# Patient Record
Sex: Male | Born: 1940 | Race: White | Hispanic: No | Marital: Married | Smoking: Former smoker
Health system: Southern US, Community
[De-identification: ages and names within clinical notes are randomized; demographics above are authoritative.]

## PROBLEM LIST (undated history)

## (undated) DIAGNOSIS — N400 Enlarged prostate without lower urinary tract symptoms: Secondary | ICD-10-CM

## (undated) DIAGNOSIS — J45909 Unspecified asthma, uncomplicated: Secondary | ICD-10-CM

## (undated) DIAGNOSIS — J302 Other seasonal allergic rhinitis: Secondary | ICD-10-CM

## (undated) DIAGNOSIS — G4700 Insomnia, unspecified: Secondary | ICD-10-CM

## (undated) DIAGNOSIS — I10 Essential (primary) hypertension: Secondary | ICD-10-CM

## (undated) DIAGNOSIS — E7800 Pure hypercholesterolemia, unspecified: Secondary | ICD-10-CM

## (undated) DIAGNOSIS — K219 Gastro-esophageal reflux disease without esophagitis: Secondary | ICD-10-CM

## (undated) DIAGNOSIS — R001 Bradycardia, unspecified: Secondary | ICD-10-CM

## (undated) DIAGNOSIS — R252 Cramp and spasm: Secondary | ICD-10-CM

## (undated) DIAGNOSIS — I00 Rheumatic fever without heart involvement: Secondary | ICD-10-CM

## (undated) DIAGNOSIS — A809 Acute poliomyelitis, unspecified: Secondary | ICD-10-CM

## (undated) DIAGNOSIS — R55 Syncope and collapse: Secondary | ICD-10-CM

## (undated) DIAGNOSIS — L409 Psoriasis, unspecified: Secondary | ICD-10-CM

## (undated) HISTORY — DX: Acute poliomyelitis, unspecified: A80.9

## (undated) HISTORY — DX: Cramp and spasm: R25.2

## (undated) HISTORY — DX: Essential (primary) hypertension: I10

## (undated) HISTORY — DX: Rheumatic fever without heart involvement: I00

## (undated) HISTORY — DX: Syncope and collapse: R55

## (undated) HISTORY — DX: Other seasonal allergic rhinitis: J30.2

## (undated) HISTORY — DX: Unspecified asthma, uncomplicated: J45.909

## (undated) HISTORY — DX: Benign prostatic hyperplasia without lower urinary tract symptoms: N40.0

## (undated) HISTORY — DX: Psoriasis, unspecified: L40.9

## (undated) HISTORY — DX: Insomnia, unspecified: G47.00

## (undated) HISTORY — DX: Gastro-esophageal reflux disease without esophagitis: K21.9

## (undated) HISTORY — DX: Pure hypercholesterolemia, unspecified: E78.00

## (undated) HISTORY — DX: Bradycardia, unspecified: R00.1

---

## 2009-10-11 ENCOUNTER — Ambulatory Visit (HOSPITAL_COMMUNITY): Admission: RE | Admit: 2009-10-11 | Discharge: 2009-10-11 | Payer: Self-pay | Admitting: Cardiology

## 2009-10-21 DIAGNOSIS — R55 Syncope and collapse: Secondary | ICD-10-CM

## 2009-10-21 HISTORY — DX: Syncope and collapse: R55

## 2011-08-28 ENCOUNTER — Emergency Department (HOSPITAL_COMMUNITY)
Admission: EM | Admit: 2011-08-28 | Discharge: 2011-08-28 | Disposition: A | Discharge status: Home / Self Care | Payer: BC Managed Care – PPO | Attending: Emergency Medicine | Admitting: Emergency Medicine

## 2011-08-28 DIAGNOSIS — R55 Syncope and collapse: Secondary | ICD-10-CM | POA: Insufficient documentation

## 2011-08-28 DIAGNOSIS — I10 Essential (primary) hypertension: Secondary | ICD-10-CM | POA: Insufficient documentation

## 2011-08-28 DIAGNOSIS — R110 Nausea: Secondary | ICD-10-CM | POA: Insufficient documentation

## 2011-08-28 DIAGNOSIS — E7800 Pure hypercholesterolemia, unspecified: Secondary | ICD-10-CM | POA: Insufficient documentation

## 2011-08-28 DIAGNOSIS — R42 Dizziness and giddiness: Secondary | ICD-10-CM | POA: Insufficient documentation

## 2011-08-28 DIAGNOSIS — Z8612 Personal history of poliomyelitis: Secondary | ICD-10-CM | POA: Insufficient documentation

## 2011-08-28 LAB — CBC
MCHC: 35.6 g/dL (ref 30.0–36.0)
Platelets: 153 10*3/uL (ref 150–400)
RDW: 12.6 % (ref 11.5–15.5)

## 2011-08-28 LAB — URINALYSIS, ROUTINE W REFLEX MICROSCOPIC
Hgb urine dipstick: NEGATIVE
Nitrite: NEGATIVE
Protein, ur: NEGATIVE mg/dL
Urobilinogen, UA: 1 mg/dL (ref 0.0–1.0)

## 2011-08-28 LAB — BASIC METABOLIC PANEL
Calcium: 8 mg/dL — ABNORMAL LOW (ref 8.4–10.5)
GFR calc Af Amer: >60 mL/min (ref >60)
GFR calc non Af Amer: >60 mL/min (ref >60)
Glucose, Bld: 108 mg/dL — ABNORMAL HIGH (ref 70–99)
Sodium: 141 mEq/L (ref 135–145)

## 2011-08-28 LAB — DIFFERENTIAL
Basophils Absolute: 0 10*3/uL (ref 0.0–0.1)
Basophils Relative: 0 % (ref 0–1)
Eosinophils Absolute: 0.1 10*3/uL (ref 0.0–0.7)
Eosinophils Relative: 2 % (ref 0–5)
Monocytes Absolute: 0.5 10*3/uL (ref 0.1–1.0)

## 2012-07-06 ENCOUNTER — Other Ambulatory Visit: Payer: Self-pay | Admitting: Family Medicine

## 2012-07-06 DIAGNOSIS — N5089 Other specified disorders of the male genital organs: Secondary | ICD-10-CM

## 2012-07-14 ENCOUNTER — Ambulatory Visit
Admission: RE | Admit: 2012-07-14 | Discharge: 2012-07-14 | Disposition: A | Discharge status: Home / Self Care | Payer: BC Managed Care – PPO | Source: Ambulatory Visit | Attending: Family Medicine | Admitting: Family Medicine

## 2012-07-14 DIAGNOSIS — N5089 Other specified disorders of the male genital organs: Secondary | ICD-10-CM

## 2012-12-20 IMAGING — US US SCROTUM
1 series · 14 of 25 positions shown · non-contrast
Comparison: None

CLINICAL DATA: 70-year-old male with left testicular mass
identified on clinical examination.

ULTRASOUND OF SCROTUM
TECHNIQUE: Complete ultrasound examination of the testicles,
epididymis, and other scrotal structures was performed.

[Series 1: us scrotum · 0.07mm/px · 14 of 61 slices shown]
[im 1/61]
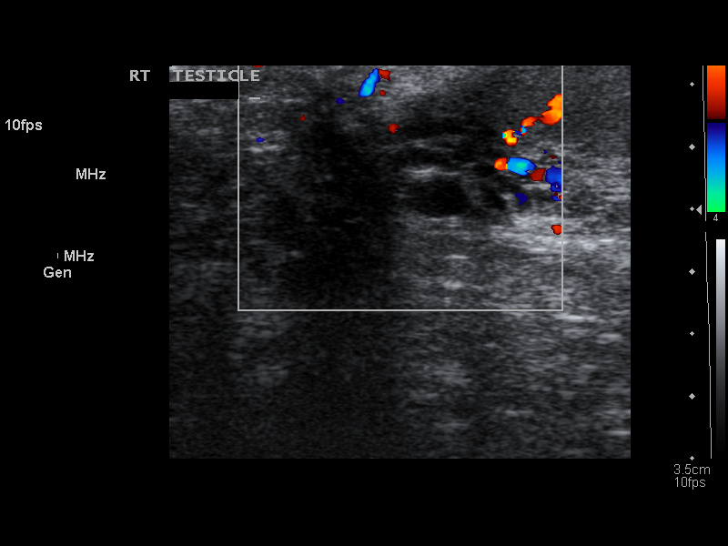
[im 6/61]
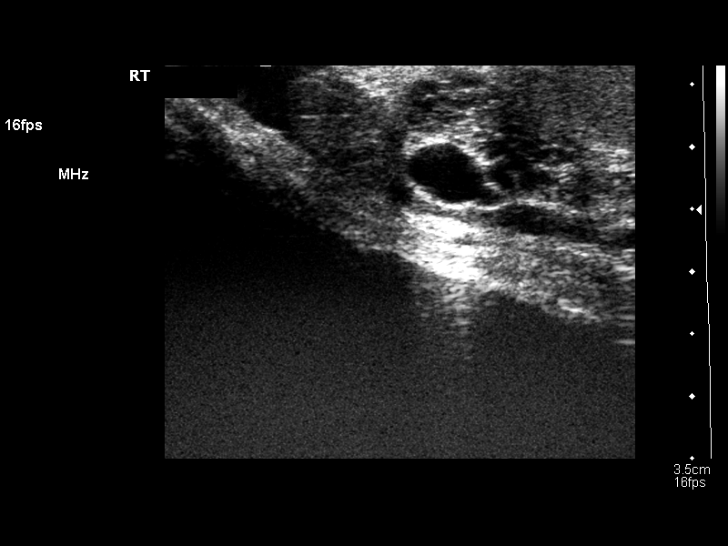
[im 11/61]
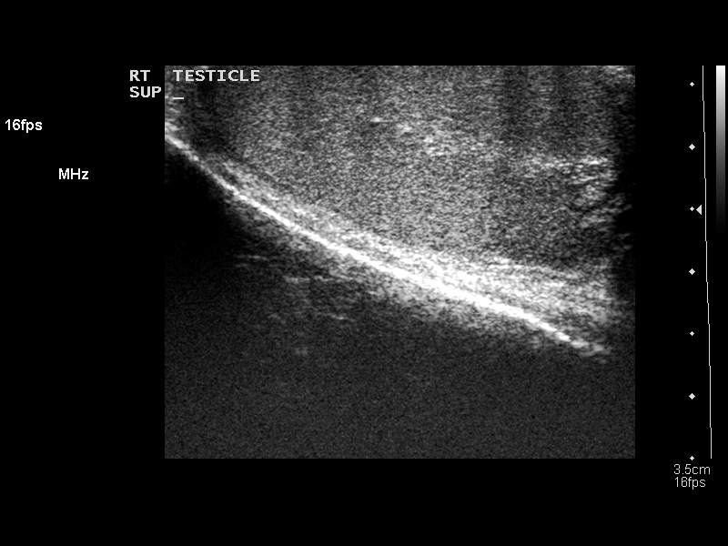
[im 16/61]
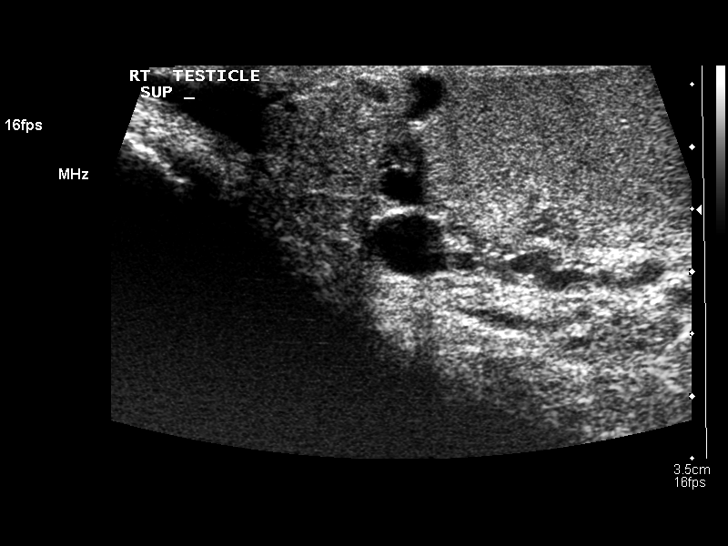
[im 21/61]
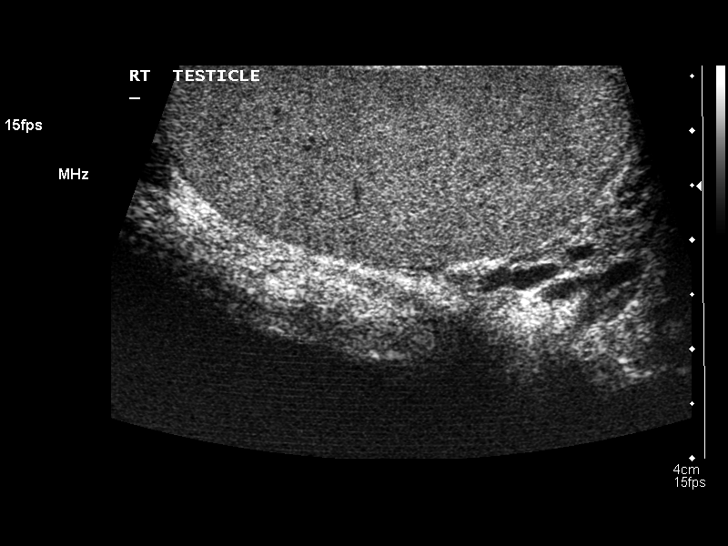
[im 23/61]
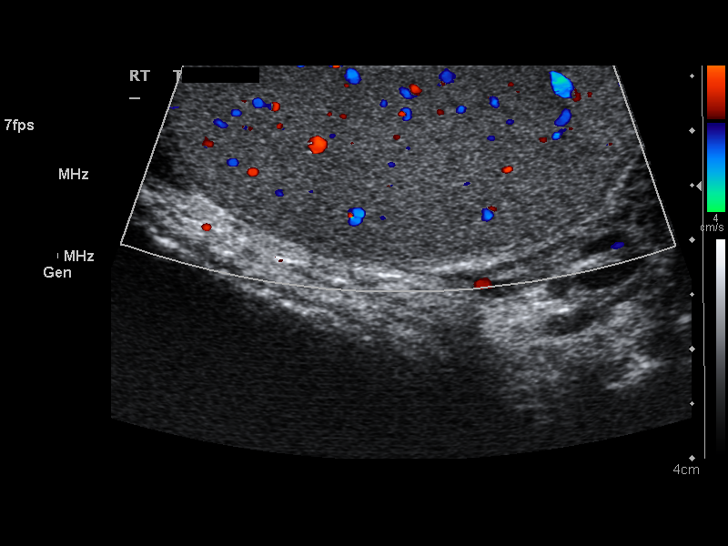
[im 28/61]
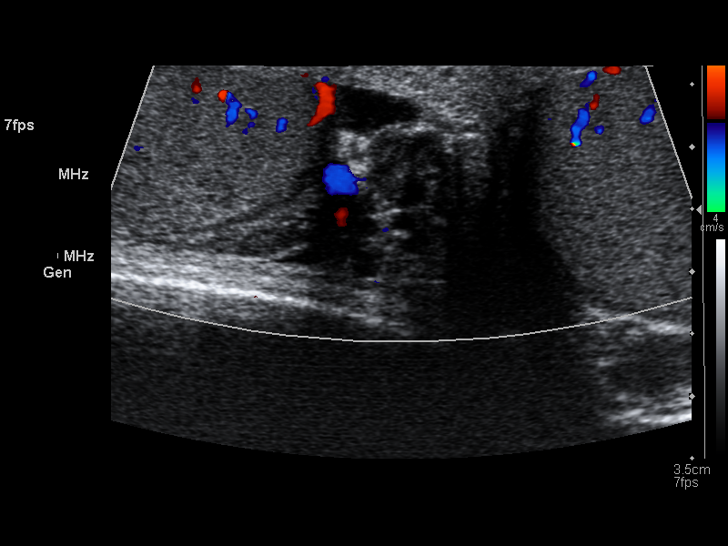
[im 33/61]
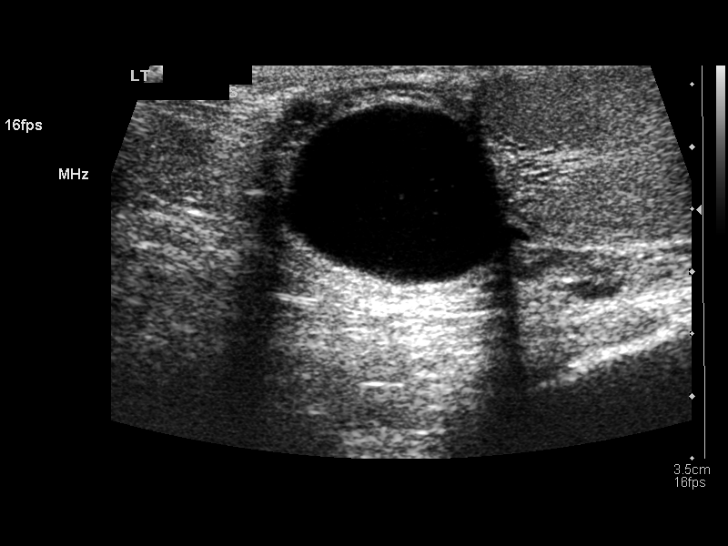
[im 38/61]
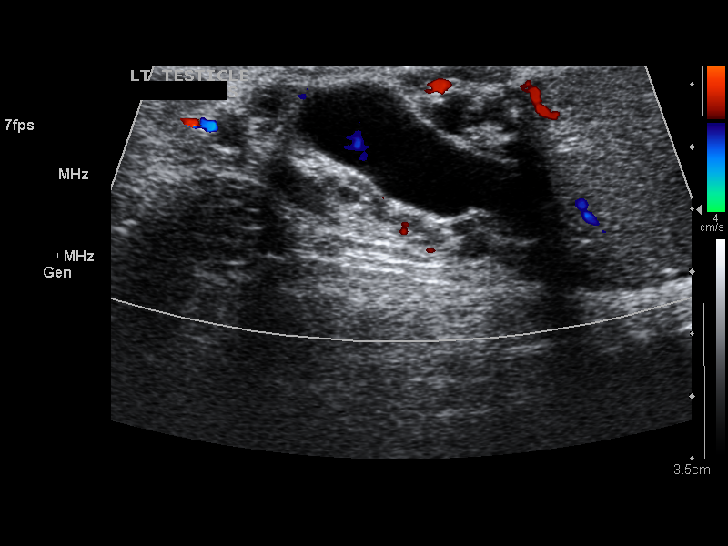
[im 41/61]
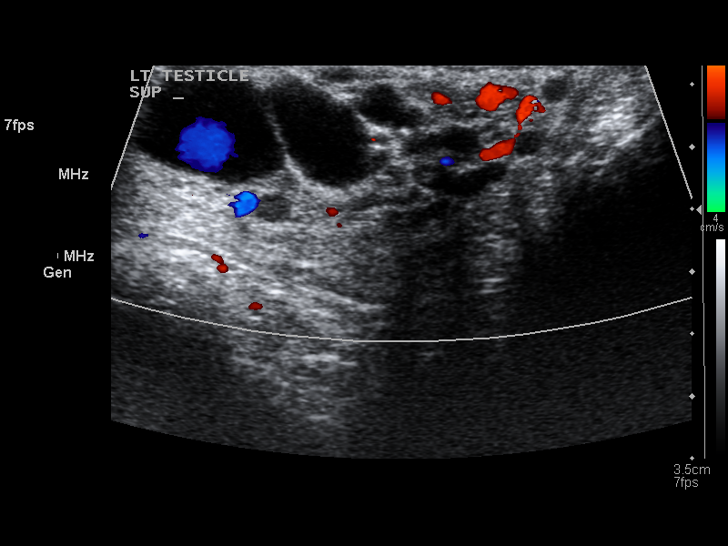
[im 46/61]
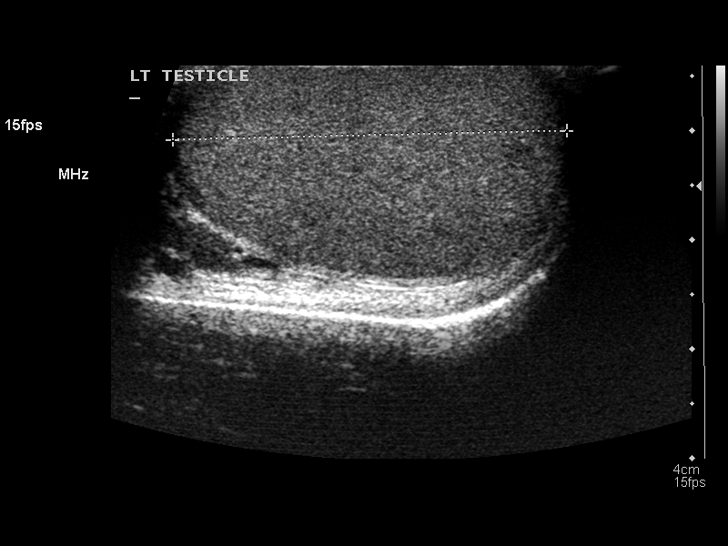
[im 51/61]
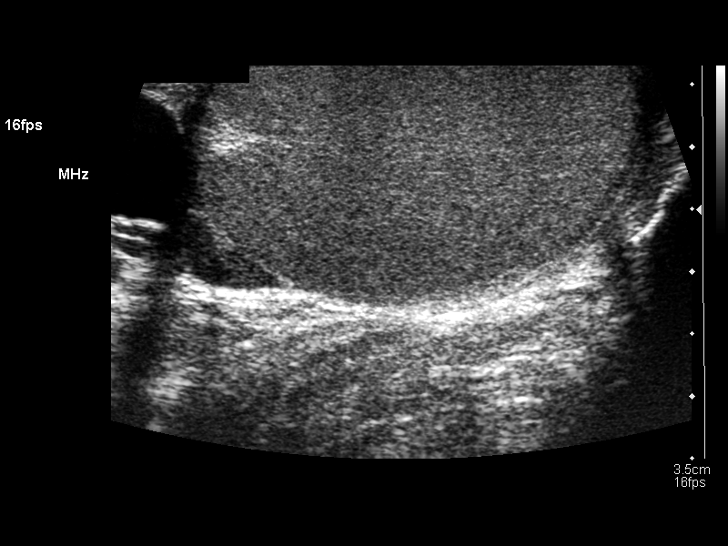
[im 56/61]
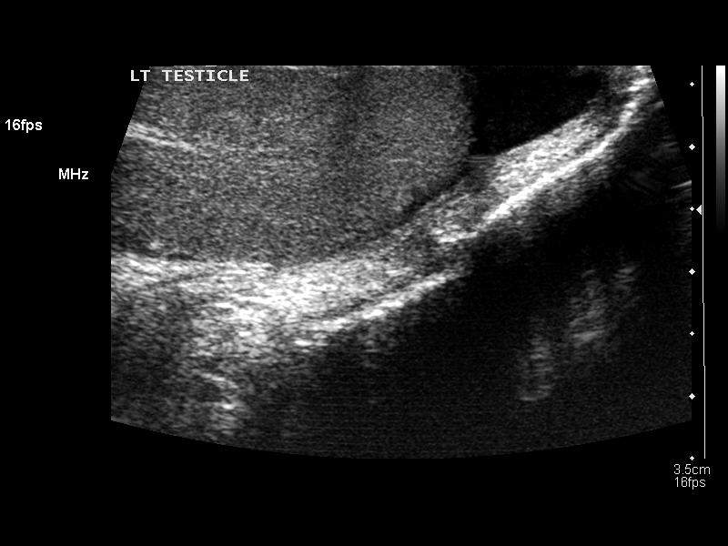
[im 61/61]
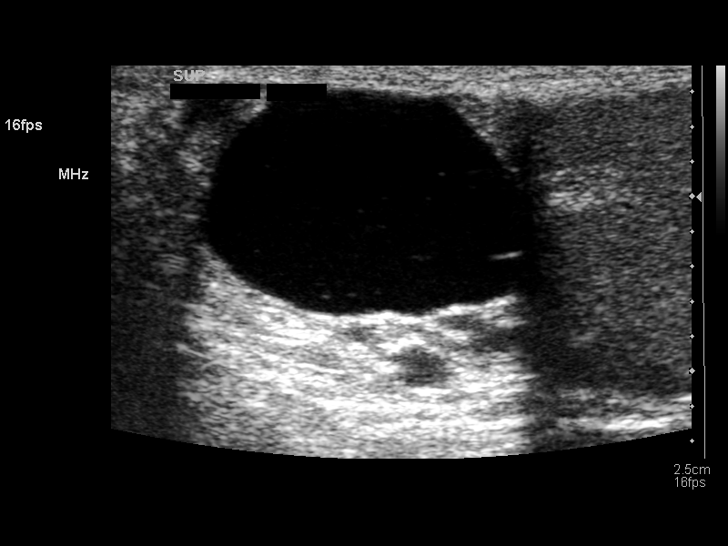

[14 of 25 positions shown; findings below may reference images not displayed]

FINDINGS: Right testis:  The right testicle is normal in echogenicity and
size.  No focal testicular lesions are identified.  Normal color
Doppler flow is present.

Left testis:  The left testicle is normal in echogenicity and size.
No focal testicular lesions are identified.  Normal color Doppler
flow is present.

Right epididymis:  Small epididymal cysts are present.

Left epididymis:  Two separate 1.8 cm epididymal
cysts/spermatoceles are identified, corresponding to the area of
palpable concern.

Hydrocele:  Very small bilateral hydroceles are present.

Varicocele:  Absent
IMPRESSION: Two separate 1.8 cm left epididymal cysts/spermatoceles correspond
to the area of palpable concern.

Normal testicles bilaterally.

## 2014-01-25 ENCOUNTER — Other Ambulatory Visit: Payer: Self-pay | Admitting: *Deleted

## 2014-01-25 MED ORDER — LOSARTAN POTASSIUM 50 MG PO TABS: 50.0000 mg | ORAL_TABLET | Freq: Every day | ORAL | Status: DC

## 2014-02-16 ENCOUNTER — Encounter: Payer: Self-pay | Admitting: General Surgery

## 2014-02-16 DIAGNOSIS — R55 Syncope and collapse: Secondary | ICD-10-CM

## 2014-02-16 DIAGNOSIS — I10 Essential (primary) hypertension: Secondary | ICD-10-CM

## 2014-02-19 ENCOUNTER — Ambulatory Visit (INDEPENDENT_AMBULATORY_CARE_PROVIDER_SITE_OTHER): Payer: Medicare Other | Admitting: Cardiology

## 2014-02-19 ENCOUNTER — Encounter: Payer: Self-pay | Admitting: Cardiology

## 2014-02-19 VITALS — BP 144/76 | HR 66 | Ht 68.0 in | Wt 189.0 lb

## 2014-02-19 DIAGNOSIS — R55 Syncope and collapse: Secondary | ICD-10-CM

## 2014-02-19 DIAGNOSIS — I10 Essential (primary) hypertension: Secondary | ICD-10-CM

## 2014-02-19 MED ORDER — VALSARTAN 80 MG PO TABS: 80.0000 mg | ORAL_TABLET | Freq: Every day | ORAL | Status: DC

## 2014-02-19 NOTE — Patient Instructions (Signed)
Your physician recommends that you continue on your current medications as directed. Please refer to the Current Medication list given to you today.  Your physician wants you to follow-up in: 1 year with Dr. Turner. You will receive a reminder letter in the mail two months in advance. If you don't receive a letter, please call our office to schedule the follow-up appointment.  

## 2014-02-19 NOTE — Progress Notes (Signed)
[address] [place], [place]  [address] Phone: [phone]) [phone] Fax:  [phone]  Date:  02/19/2014   ID:  Louis CalkinsJames Spaugh, DOB Nov 29, 1941, MRN [id number]  PCP:  Lolita PatellaEADE,ROBERT ALEXANDER, MD  Cardiologist:  Armanda Magicraci Juanluis Guastella, MD     History of Present Illness: Louis CalkinsJames Saunders is a 73 y.o. male with a history of vasovagal syncope, HTN, rheumatic fever as a child and bradycardia who presents today for followup.  He is doing well.  He denies any dizzy spells or syncope.  He denies any chest pain, SOB, DOE, LE edema or palpitations.   Wt Readings from Last 3 Encounters:  02/19/14 189 lb (85.73 kg)     Past Medical History  Diagnosis Date  . Hypertension   . Hypercholesteremia   . Seasonal allergies   . Asthma     mild exercise induced  . Insomnia     periodic  . Leg cramps   . Psoriasis   . BPH (benign prostatic hypertrophy)   . GERD (gastroesophageal reflux disease)   . Polio     age 72 months. Subsequently needed ortho sx  . Rheumatic fever     X2 as a child with out sequela  . Vasovagal syncope 10/21/09    dx by tilt table test   . Bradycardia     Current Outpatient Prescriptions  Medication Sig Dispense Refill  . albuterol (PROVENTIL HFA;VENTOLIN HFA) 108 (90 BASE) MCG/ACT inhaler Inhale 1-2 puffs into the lungs every 6 (six) hours as needed for wheezing or shortness of breath.      Marland Kitchen. aspirin 81 MG tablet Take 81 mg by mouth daily.      . calcipotriene (DOVONOX) 0.005 % cream Apply 1 application topically as needed.      . finasteride (PROSCAR) 5 MG tablet Take 5 mg by mouth daily.      Marland Kitchen. levocetirizine (XYZAL) 5 MG tablet Take 5 mg by mouth every evening.      . Magnesium 400 MG CAPS Take 1 capsule by mouth daily.      . montelukast (SINGULAIR) 10 MG tablet Take 10 mg by mouth at bedtime.      . Omega 3 1000 MG CAPS Take 2 capsules by mouth daily.      Marland Kitchen. omeprazole (PRILOSEC) 20 MG capsule Take 20 mg by mouth daily.      . Probiotic Product (PROBIOTIC DAILY PO) Take  by mouth.      . rosuvastatin (CRESTOR) 5 MG tablet Take 2.5 mg by mouth daily.      . sertraline (ZOLOFT) 50 MG tablet Take 50 mg by mouth daily.      . valsartan (DIOVAN) 80 MG tablet Take 80 mg by mouth daily.      Marland Kitchen. zolpidem (AMBIEN) 10 MG tablet Take 10 mg by mouth at bedtime as needed for sleep.       No current facility-administered medications for this visit.    Allergies:    Allergies  Allergen Reactions  . Beta Adrenergic Blockers     Associated with Vaso-Vagal episodes  . Biaxin [Clarithromycin] Hives and Swelling  . Codeine Nausea Only    COLD SWEATS  . Doxycycline     Upset Stomach  . Lipitor [Atorvastatin]     Muscle aches    Social History:  The patient  reports that he quit smoking about 47 years ago. He does not have any smokeless tobacco history on file. He reports that he drinks alcohol. He reports  that he does not use illicit drugs.   Family History:  The patient's family history is not on file.   ROS:  Please see the history of present illness.      All other systems reviewed and negative.   PHYSICAL EXAM: VS:  BP 144/76  Pulse 66  Ht 5\' 8"  (1.727 m)  Wt 189 lb (85.73 kg)  BMI 28.74 kg/m2 Well nourished, well developed, in no acute distress HEENT: normal Neck: no JVD Cardiac:  normal S1, S2; RRR; no murmur Lungs:  clear to auscultation bilaterally, no wheezing, rhonchi or rales Abd: soft, nontender, no hepatomegaly Ext: no edema Skin: warm and dry Neuro:  CNs 2-12 intact, no focal abnormalities noted   EKG:  NSR with no ST changes   ASSESSMENT AND PLAN:  1. Vasovagal syncope with no reoccurence 2. HTN - well controlled  - continue Diovan  Followup with me in 1 year  Signed, Armanda Magic, MD 02/19/2014 4:24 PM

## 2014-05-28 ENCOUNTER — Telehealth: Payer: Self-pay | Admitting: *Deleted

## 2014-05-28 MED ORDER — SERTRALINE HCL 50 MG PO TABS: 50.0000 mg | ORAL_TABLET | Freq: Every day | ORAL | Status: DC

## 2014-05-28 NOTE — Telephone Encounter (Signed)
Message copied by Carmela Hurt on Mon May 28, 2014 10:39 AM ------      Message from: Armanda Magic R      Created: Thu May 24, 2014  5:53 PM      Regarding: RE: refill on sertraline       yes      ----- Message -----         From: Carmela Hurt, RN         Sent: 05/24/2014   4:42 PM           To: Quintella Reichert, MD, Nita Sells, CMA      Subject: refill on sertraline                                     Pharmacy requesting refill on sertraline 50 mg, do you want to refill?      Thanks, Selena Batten       ------

## 2015-01-01 ENCOUNTER — Ambulatory Visit: Payer: Self-pay | Admitting: Podiatry

## 2015-01-10 ENCOUNTER — Ambulatory Visit (INDEPENDENT_AMBULATORY_CARE_PROVIDER_SITE_OTHER): Payer: Medicare Other | Admitting: Podiatry

## 2015-01-10 ENCOUNTER — Ambulatory Visit (INDEPENDENT_AMBULATORY_CARE_PROVIDER_SITE_OTHER): Payer: Medicare Other

## 2015-01-10 VITALS — BP 112/69 | HR 71 | Resp 16 | Ht 70.0 in | Wt 173.0 lb

## 2015-01-10 DIAGNOSIS — M205X1 Other deformities of toe(s) (acquired), right foot: Secondary | ICD-10-CM | POA: Diagnosis not present

## 2015-01-10 DIAGNOSIS — B91 Sequelae of poliomyelitis: Secondary | ICD-10-CM

## 2015-01-10 DIAGNOSIS — M2042 Other hammer toe(s) (acquired), left foot: Secondary | ICD-10-CM | POA: Diagnosis not present

## 2015-01-10 DIAGNOSIS — M6281 Muscle weakness (generalized): Secondary | ICD-10-CM

## 2015-01-10 NOTE — Progress Notes (Signed)
   Subjective:    Patient ID: Louis Saunders, male    DOB: Dec 27, 1941, 74 y.o.   MRN: [id number]  HPI Comments: Been wearing orthotics for 15 years  I have rough spots on the bottom of my left foot, callused lesions and also have a sore spot on the right foot causing pain to shoot up the right leg      Review of Systems  All other systems reviewed and are negative.      Objective:   Physical Exam: I have reviewed his past mental history medications allergies surgery social history and previous chart with the [facility]. Pulses are strongly palpable bilateral. Neurologic sensorium is intact per Semmes-Weinstein monofilament deep tendon reflexes are intact bilateral muscle strength is slightly weak into the right side which is the post polio side. I see no length discrepancy. Orthopedic evaluation demonstrates all joints distal to the ankle for range of motion without crepitus mild tailor's bunion deformities are noted bilateral hammertoe deformities are noted bilateral.        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  Plan: At this point I encouraged a new set of orthotics fabricated much like facet he currently. I expressed to him that we were unable to perform this task but we will send him to [facility] for this fabrication. I debrided all reactive hyperkeratosis for him today about complications. We will follow up with him as needed.

## 2015-01-14 ENCOUNTER — Telehealth: Payer: Self-pay | Admitting: *Deleted

## 2015-01-14 NOTE — Telephone Encounter (Signed)
Pt called from [facility] states he left the prescription for the orthotic at home and would like us to fax a copy.  I told pt he was given the original prescription, pt thanked me for looking and he will go home to pick up.

## 2015-02-19 ENCOUNTER — Encounter: Payer: Self-pay | Admitting: Cardiology

## 2015-02-19 ENCOUNTER — Ambulatory Visit (INDEPENDENT_AMBULATORY_CARE_PROVIDER_SITE_OTHER): Payer: Medicare Other | Admitting: Cardiology

## 2015-02-19 VITALS — BP 130/68 | HR 63 | Ht 69.5 in | Wt 178.0 lb

## 2015-02-19 DIAGNOSIS — I10 Essential (primary) hypertension: Secondary | ICD-10-CM

## 2015-02-19 DIAGNOSIS — R55 Syncope and collapse: Secondary | ICD-10-CM

## 2015-02-19 NOTE — Progress Notes (Addendum)
Cardiology Office Note   Date:  02/19/2015   ID:  Louis Saunders, DOB 1941/07/09, MRN [id number]  PCP:  Lolita Patella, MD  Cardiologist:   Quintella Reichert, MD   Chief Complaint  Patient presents with  . vasovagal syncope  . Hypertension      History of Present Illness: Louis Saunders is a 74 y.o. male with a history of vasovagal syncope, HTN, rheumatic fever as a child and bradycardia who presents today for followup. He is doing well. He denies any dizzy spells or syncope. He denies any chest pain, SOB, DOE, LE edema or palpitations.    Past Medical History  Diagnosis Date  . Hypertension   . Hypercholesteremia   . Seasonal allergies   . Asthma     mild exercise induced  . Insomnia     periodic  . Leg cramps   . Psoriasis   . BPH (benign prostatic hypertrophy)   . GERD (gastroesophageal reflux disease)   . Polio     age 16 months. Subsequently needed ortho sx  . Rheumatic fever     X2 as a child with out sequela  . Vasovagal syncope 10/21/09    dx by tilt table test   . Bradycardia     History reviewed. No pertinent past surgical history.   Current Outpatient Prescriptions  Medication Sig Dispense Refill  . albuterol (PROVENTIL HFA;VENTOLIN HFA) 108 (90 BASE) MCG/ACT inhaler Inhale 1-2 puffs into the lungs every 6 (six) hours as needed for wheezing or shortness of breath.    Marland Kitchen aspirin 81 MG tablet Take 81 mg by mouth daily.    . calcipotriene (DOVONOX) 0.005 % cream Apply 1 application topically as needed.    . finasteride (PROSCAR) 5 MG tablet Take 5 mg by mouth daily.    Marland Kitchen levocetirizine (XYZAL) 5 MG tablet Take 5 mg by mouth every evening.    . Magnesium 400 MG CAPS Take 1 capsule by mouth daily.    . montelukast (SINGULAIR) 10 MG tablet Take 10 mg by mouth at bedtime.    . Omega 3 1000 MG CAPS Take 2 capsules by mouth daily.    Marland Kitchen omeprazole (PRILOSEC) 20 MG capsule Take 20 mg by mouth daily.    . Probiotic Product (PROBIOTIC DAILY PO) Take by  mouth.    . rosuvastatin (CRESTOR) 5 MG tablet Take 2.5 mg by mouth daily.    . sertraline (ZOLOFT) 50 MG tablet Take 1 tablet (50 mg total) by mouth daily. 30 tablet 10  . valsartan (DIOVAN) 80 MG tablet Take 1 tablet (80 mg total) by mouth daily. 30 tablet 11  . zolpidem (AMBIEN) 10 MG tablet Take 10 mg by mouth at bedtime as needed for sleep.     No current facility-administered medications for this visit.    Allergies:   Hydrocodone; Oxycodone; Beta adrenergic blockers; Biaxin; Codeine; Doxycycline; and Lipitor    Social History:  The patient  reports that he quit smoking about 48 years ago. He does not have any smokeless tobacco history on file. He reports that he drinks alcohol. He reports that he does not use illicit drugs.   Family History:  The patient's family history is not on file.    ROS:  Please see the history of present illness.   Otherwise, review of systems are positive for none.   All other systems are reviewed and negative.    PHYSICAL EXAM: VS:  BP 130/68 mmHg  Pulse 63  Ht 5'  9.5" (1.765 m)  Wt 178 lb (80.74 kg)  BMI 25.92 kg/m2 , BMI Body mass index is 25.92 kg/(m^2). GEN: Well nourished, well developed, in no acute distress HEENT: normal Neck: no JVD, carotid bruits, or masses Cardiac: RRR; no murmurs, rubs, or gallops,no edema  Respiratory:  clear to auscultation bilaterally, normal work of breathing GI: soft, nontender, nondistended, + BS MS: no deformity or atrophy Skin: warm and dry, no rash Neuro:  Strength and sensation are intact Psych: euthymic mood, full affect   EKG:  EKG is ordered today. The ekg ordered today demonstrates NSR with IRBBB   Recent Labs: No results found for requested labs within last 365 days.    Lipid Panel No results found for: CHOL, TRIG, HDL, CHOLHDL, VLDL, LDLCALC, LDLDIRECT    Wt Readings from Last 3 Encounters:  02/19/15 178 lb (80.74 kg)  01/10/15 173 lb (78.472 kg)  02/19/14 189 lb (85.73 kg)       ASSESSMENT AND PLAN:  1. Vasovagal syncope with no reoccurence 2. HTN - well controlled - continue Diovan     Current medicines are reviewed at length with the patient today.  The patient does not have concerns regarding medicines.  The following changes have been made:  no change  Labs/ tests ordered today include: None   Orders Placed This Encounter  Procedures  . EKG 12-Lead     Disposition:   FU with me in 1 year   Signed, Quintella ReichertURNER,Antinette Keough R, MD  02/19/2015 2:31 PM    [facility] [address], [place], [place]  [address] Phone: [phone]) [phone]; Fax: [phone](336) [phone]

## 2015-02-19 NOTE — Patient Instructions (Signed)
Your physician recommends that you continue on your current medications as directed. Please refer to the Current Medication list given to you today.  Your physician wants you to follow-up in: 1 year with Dr. Turner. You will receive a reminder letter in the mail two months in advance. If you don't receive a letter, please call our office to schedule the follow-up appointment.  

## 2015-03-11 ENCOUNTER — Other Ambulatory Visit: Payer: Self-pay | Admitting: Cardiology

## 2015-06-17 ENCOUNTER — Telehealth: Payer: Self-pay | Admitting: *Deleted

## 2015-06-17 MED ORDER — SERTRALINE HCL 50 MG PO TABS: 50.0000 mg | ORAL_TABLET | Freq: Every day | ORAL | Status: AC

## 2015-06-17 NOTE — Telephone Encounter (Signed)
Ok to refill 

## 2015-06-17 NOTE — Telephone Encounter (Signed)
Rx sent 

## 2015-06-17 NOTE — Telephone Encounter (Signed)
-----   Message from Awilda Bill, [place] sent at 06/17/2015 9:43 AM EDT -----    Pt's pharm requesting zoloft refill from Dr. Mayford Knife, does she do this? If so I do not fill these, so please fill it if turner says it ok, thanks
